# Patient Record
Sex: Female | Born: 1950 | Race: Black or African American | Hispanic: No | Smoking: Never smoker
Health system: Southern US, Community
[De-identification: ages and names within clinical notes are randomized; demographics above are authoritative.]

## PROBLEM LIST (undated history)

## (undated) DIAGNOSIS — G4730 Sleep apnea, unspecified: Secondary | ICD-10-CM

## (undated) DIAGNOSIS — J42 Unspecified chronic bronchitis: Secondary | ICD-10-CM

## (undated) DIAGNOSIS — F32A Depression, unspecified: Secondary | ICD-10-CM

## (undated) DIAGNOSIS — K5790 Diverticulosis of intestine, part unspecified, without perforation or abscess without bleeding: Secondary | ICD-10-CM

## (undated) DIAGNOSIS — G629 Polyneuropathy, unspecified: Secondary | ICD-10-CM

## (undated) DIAGNOSIS — A6000 Herpesviral infection of urogenital system, unspecified: Secondary | ICD-10-CM

## (undated) DIAGNOSIS — J45909 Unspecified asthma, uncomplicated: Secondary | ICD-10-CM

## (undated) DIAGNOSIS — M216X9 Other acquired deformities of unspecified foot: Secondary | ICD-10-CM

## (undated) DIAGNOSIS — I10 Essential (primary) hypertension: Secondary | ICD-10-CM

## (undated) DIAGNOSIS — D582 Other hemoglobinopathies: Secondary | ICD-10-CM

## (undated) DIAGNOSIS — B159 Hepatitis A without hepatic coma: Secondary | ICD-10-CM

## (undated) DIAGNOSIS — M1990 Unspecified osteoarthritis, unspecified site: Secondary | ICD-10-CM

## (undated) DIAGNOSIS — F329 Major depressive disorder, single episode, unspecified: Secondary | ICD-10-CM

---

## 2016-07-16 ENCOUNTER — Emergency Department (HOSPITAL_COMMUNITY)
Admission: EM | Admit: 2016-07-16 | Discharge: 2016-07-16 | Disposition: A | Discharge status: Home / Self Care | Payer: PRIVATE HEALTH INSURANCE | Attending: Emergency Medicine | Admitting: Emergency Medicine

## 2016-07-16 ENCOUNTER — Encounter (HOSPITAL_COMMUNITY): Payer: Self-pay

## 2016-07-16 ENCOUNTER — Emergency Department (HOSPITAL_COMMUNITY): Payer: PRIVATE HEALTH INSURANCE

## 2016-07-16 DIAGNOSIS — J45909 Unspecified asthma, uncomplicated: Secondary | ICD-10-CM | POA: Insufficient documentation

## 2016-07-16 DIAGNOSIS — R1030 Lower abdominal pain, unspecified: Secondary | ICD-10-CM | POA: Diagnosis present

## 2016-07-16 DIAGNOSIS — I10 Essential (primary) hypertension: Secondary | ICD-10-CM | POA: Diagnosis not present

## 2016-07-16 DIAGNOSIS — Z79899 Other long term (current) drug therapy: Secondary | ICD-10-CM | POA: Diagnosis not present

## 2016-07-16 DIAGNOSIS — M25551 Pain in right hip: Secondary | ICD-10-CM | POA: Diagnosis not present

## 2016-07-16 HISTORY — DX: Hepatitis a without hepatic coma: B15.9

## 2016-07-16 HISTORY — DX: Other acquired deformities of unspecified foot: M21.6X9

## 2016-07-16 HISTORY — DX: Polyneuropathy, unspecified: G62.9

## 2016-07-16 HISTORY — DX: Morbid (severe) obesity due to excess calories: E66.01

## 2016-07-16 HISTORY — DX: Major depressive disorder, single episode, unspecified: F32.9

## 2016-07-16 HISTORY — DX: Other hemoglobinopathies: D58.2

## 2016-07-16 HISTORY — DX: Depression, unspecified: F32.A

## 2016-07-16 HISTORY — DX: Unspecified chronic bronchitis: J42

## 2016-07-16 HISTORY — DX: Essential (primary) hypertension: I10

## 2016-07-16 HISTORY — DX: Unspecified osteoarthritis, unspecified site: M19.90

## 2016-07-16 HISTORY — DX: Herpesviral infection of urogenital system, unspecified: A60.00

## 2016-07-16 HISTORY — DX: Sleep apnea, unspecified: G47.30

## 2016-07-16 HISTORY — DX: Unspecified asthma, uncomplicated: J45.909

## 2016-07-16 HISTORY — DX: Diverticulosis of intestine, part unspecified, without perforation or abscess without bleeding: K57.90

## 2016-07-16 LAB — CBC WITH DIFFERENTIAL/PLATELET
BASOS PCT: 0 %
Basophils Absolute: 0 10*3/uL (ref 0.0–0.1)
EOS ABS: 0.1 10*3/uL (ref 0.0–0.7)
EOS PCT: 1 %
HCT: 34 % — ABNORMAL LOW (ref 36.0–46.0)
HEMOGLOBIN: 11 g/dL — AB (ref 12.0–15.0)
Lymphocytes Relative: 18 %
Lymphs Abs: 2.5 10*3/uL (ref 0.7–4.0)
MCH: 23.1 pg — AB (ref 26.0–34.0)
MCHC: 32.4 g/dL (ref 30.0–36.0)
MCV: 71.4 fL — ABNORMAL LOW (ref 78.0–100.0)
MONO ABS: 0.4 10*3/uL (ref 0.1–1.0)
MONOS PCT: 3 %
NEUTROS PCT: 78 %
Neutro Abs: 10.8 10*3/uL — ABNORMAL HIGH (ref 1.7–7.7)
PLATELETS: 365 10*3/uL (ref 150–400)
RBC: 4.76 MIL/uL (ref 3.87–5.11)
RDW: 15.9 % — AB (ref 11.5–15.5)
WBC: 13.9 10*3/uL — ABNORMAL HIGH (ref 4.0–10.5)

## 2016-07-16 LAB — COMPREHENSIVE METABOLIC PANEL
ALK PHOS: 65 U/L (ref 38–126)
ALT: 21 U/L (ref 14–54)
AST: 25 U/L (ref 15–41)
Albumin: 3.5 g/dL (ref 3.5–5.0)
Anion gap: 7 (ref 5–15)
BUN: 16 mg/dL (ref 6–20)
CALCIUM: 8.7 mg/dL — AB (ref 8.9–10.3)
CO2: 23 mmol/L (ref 22–32)
CREATININE: 0.76 mg/dL (ref 0.44–1.00)
Chloride: 103 mmol/L (ref 101–111)
GFR calc non Af Amer: >60 mL/min (ref >60)
Glucose, Bld: 112 mg/dL — ABNORMAL HIGH (ref 65–99)
Potassium: 4.2 mmol/L (ref 3.5–5.1)
SODIUM: 133 mmol/L — AB (ref 135–145)
Total Bilirubin: 0.7 mg/dL (ref 0.3–1.2)
Total Protein: 7.5 g/dL (ref 6.5–8.1)

## 2016-07-16 LAB — URINALYSIS, ROUTINE W REFLEX MICROSCOPIC
BILIRUBIN URINE: NEGATIVE
Glucose, UA: NEGATIVE mg/dL
HGB URINE DIPSTICK: NEGATIVE
KETONES UR: NEGATIVE mg/dL
Leukocytes, UA: NEGATIVE
NITRITE: NEGATIVE
PROTEIN: NEGATIVE mg/dL
Specific Gravity, Urine: 1.023 (ref 1.005–1.030)
pH: 5 (ref 5.0–8.0)

## 2016-07-16 MED ORDER — CYCLOBENZAPRINE HCL 5 MG PO TABS
5.0000 mg | ORAL_TABLET | Freq: Two times a day (BID) | ORAL | 0 refills | Status: AC | PRN
Start: 2016-07-16 — End: ?

## 2016-07-16 MED ORDER — HYDROCODONE-ACETAMINOPHEN 5-325 MG PO TABS
1.0000 | ORAL_TABLET | Freq: Once | ORAL | Status: AC
  Administered 2016-07-16: 1 via ORAL
  Filled 2016-07-16: qty 1

## 2016-07-16 MED ORDER — KETOROLAC TROMETHAMINE 30 MG/ML IJ SOLN
30.0000 mg | Freq: Once | INTRAMUSCULAR | Status: AC
  Administered 2016-07-16: 30 mg via INTRAMUSCULAR
  Filled 2016-07-16: qty 1

## 2016-07-16 MED ORDER — NAPROXEN 500 MG PO TABS: 500.0000 mg | ORAL_TABLET | Freq: Two times a day (BID) | ORAL | 0 refills | Status: AC

## 2016-07-16 NOTE — ED Notes (Signed)
Pt returned to room from xray.

## 2016-07-16 NOTE — ED Notes (Signed)
Patient left at this time with all belongings. 

## 2016-07-16 NOTE — ED Notes (Signed)
Pt ambulatory without assistance to bathroom.  ?

## 2016-07-16 NOTE — ED Provider Notes (Signed)
[facility]-EMERGENCY DEPT Provider Note   CSN: [id number] Arrival date & time: 07/16/16  0113     History   Chief Complaint Chief Complaint  Patient presents with  . Groin Pain    HPI Adriana Murphy is a 66 y.o. female.  HPI  This is a 66 year old female who presents with right-sided groin pain. Patient reports onset of symptoms and worsening of symptoms in the last 24 hours. She states that she did do a lot of standing on Saturday. She did physical therapy exercises on Sunday with increasing pain of the right groin. Pain is worse with sitting. It is nonradiating. Currently it is 10 out of 10. She has not taken anything at home for the pain. She has been ambulatory and weightbearing. Denies any abdominal pain, nausea, vomiting urinary symptoms. Denies any fevers. She is from [place] visiting.  Past Medical History:  Diagnosis Date  . Arthritis   . Asthma   . Chronic bronchitis (HCC)   . Depression   . Diverticulosis   . Genital herpes   . Hemoglobin C trait (HCC)   . Hepatitis A   . Hypertension   . Morbid obesity (HCC)   . Neuropathy   . Pronation of feet   . Sleep apnea     There are no active problems to display for this patient.   History reviewed. No pertinent surgical history.  OB History    No data available       Home Medications    Prior to Admission medications   Medication Sig Start Date End Date Taking? Authorizing Provider  acyclovir (ZOVIRAX) 400 MG tablet Take 400 mg by mouth 2 (two) times daily.   Yes [provider]  albuterol (PROVENTIL HFA;VENTOLIN HFA) 108 (90 Base) MCG/ACT inhaler Inhale 1-2 puffs into the lungs every 6 (six) hours as needed for wheezing or shortness of breath.   Yes [provider]  budesonide-formoterol (SYMBICORT) 160-4.5 MCG/ACT inhaler Inhale 2 puffs into the lungs 2 (two) times daily.   Yes [provider]  buPROPion (WELLBUTRIN XL) 300 MG 24 hr tablet Take 300 mg by mouth daily.   Yes [provider]  fluticasone (FLONASE) 50 MCG/ACT nasal spray Place 2 sprays into both nostrils daily.   Yes [provider]  loratadine-pseudoephedrine (CLARITIN-D 24-HOUR) 10-240 MG 24 hr tablet Take 1 tablet by mouth daily.   Yes [provider]  montelukast (SINGULAIR) 10 MG tablet Take 10 mg by mouth daily.   Yes [provider]  PARoxetine (PAXIL) 40 MG tablet Take 40 mg by mouth daily.   Yes [provider]  traZODone (DESYREL) 50 MG tablet Take 25 mg by mouth at bedtime.   Yes [provider]  valsartan (DIOVAN) 80 MG tablet Take 80 mg by mouth daily.   Yes [provider]  cyclobenzaprine (FLEXERIL) 5 MG tablet Take 1 tablet (5 mg total) by mouth 2 (two) times daily as needed for muscle spasms. 07/16/16   Horton, Mayer Masker, MD  naproxen (NAPROSYN) 500 MG tablet Take 1 tablet (500 mg total) by mouth 2 (two) times daily. 07/16/16   Horton, Mayer Masker, MD    Family History History reviewed. No pertinent family history.  Social History Social History  Substance Use Topics  . Smoking status: Never Smoker  . Smokeless tobacco: Not on file  . Alcohol use No     Allergies   Neosporin [neomycin-bacitracin zn-polymyx]; Sulfa antibiotics; Darvon [propoxyphene]; Demerol [meperidine]; and Percocet [oxycodone-acetaminophen]  Review of Systems Review of Systems  Constitutional: Negative for fever.  Gastrointestinal: Negative for abdominal pain and vomiting.  Musculoskeletal: Negative for back pain.       Right hip pain  All other systems reviewed and are negative.    Physical Exam Updated Vital Signs BP (!) 157/78 (BP Location: Right Arm)   Pulse 79   Temp 97.5 F (36.4 C) (Oral)   Resp 18   Ht 5' 4.5" (1.638 m)   Wt (!) 139.7 kg (308 lb)   SpO2 96%   BMI 52.05 kg/m   Physical Exam  Constitutional: She is oriented to person, place, and time. She appears well-developed and well-nourished.  Morbidly obese  HENT:  Head:  Normocephalic and atraumatic.  Cardiovascular: Normal rate, regular rhythm and normal heart sounds.   Pulmonary/Chest: Effort normal and breath sounds normal. No respiratory distress. She has no wheezes.  Abdominal: Soft. Bowel sounds are normal. There is no tenderness. There is no guarding.  Musculoskeletal:  Normal range of motion of the right hip, tenderness to palpation of the lateral aspect of the hip, no overlying skin changes, no masses noted in the groin  Neurological: She is alert and oriented to person, place, and time.  Skin: Skin is warm and dry.  Psychiatric: She has a normal mood and affect.  Nursing note and vitals reviewed.    ED Treatments / Results  Labs (all labs ordered are listed, but only abnormal results are displayed) Labs Reviewed  COMPREHENSIVE METABOLIC PANEL - Abnormal; Notable for the following:       Result Value   Sodium 133 (*)    Glucose, Bld 112 (*)    Calcium 8.7 (*)    All other components within normal limits  CBC WITH DIFFERENTIAL/PLATELET - Abnormal; Notable for the following:    WBC 13.9 (*)    Hemoglobin 11.0 (*)    HCT 34.0 (*)    MCV 71.4 (*)    MCH 23.1 (*)    RDW 15.9 (*)    Neutro Abs 10.8 (*)    All other components within normal limits  URINALYSIS, ROUTINE W REFLEX MICROSCOPIC - Abnormal; Notable for the following:    APPearance HAZY (*)    All other components within normal limits    EKG  EKG Interpretation None       Radiology Dg Hip Unilat W Or Wo Pelvis 2-3 Views Right  Result Date: 07/16/2016 CLINICAL DATA:  Right hip pain for 1 day.  No known injury. EXAM: DG HIP (WITH OR WITHOUT PELVIS) 2-3V RIGHT COMPARISON:  None. FINDINGS: The cortical margins of the bony pelvis and right hip are intact. No fracture. Pubic symphysis and sacroiliac joints are congruent. Both femoral heads are well-seated in the respective acetabula. Mild right acetabular spurring. IMPRESSION: Mild right hip osteoarthritis.  No acute bony  abnormality. Electronically Signed   By: Rubye OaksMelanie  Ehinger M.D.   On: 07/16/2016 05:21    Procedures Procedures (including critical care time)  Medications Ordered in ED Medications  ketorolac (TORADOL) 30 MG/ML injection 30 mg (30 mg Intramuscular Given 07/16/16 0440)  HYDROcodone-acetaminophen (NORCO/VICODIN) 5-325 MG per tablet 1 tablet (1 tablet Oral Given 07/16/16 0440)  HYDROcodone-acetaminophen (NORCO/VICODIN) 5-325 MG per tablet 1 tablet (1 tablet Oral Given 07/16/16 [id number])     Initial Impression / Assessment and Plan / ED Course  I have reviewed the triage vital signs and the nursing notes.  Pertinent labs & imaging results that were available during my care of  the patient were reviewed by me and considered in my medical decision making (see chart for details).     Patient presents with right groin and hip pain. Worse with certain movements and sitting. Seems musculoskeletal in nature. She has no abdominal pain or reproducible tenderness on exam. She is neurovascularly intact. Labs sent from triage reviewed and notable for a leukocytosis of unclear significance. Patient denies any fevers and there are no overlying skin changes to suggest cellulitis. X-rays show right-sided hip arthritis. Patient much improved with Toradol and Norco. She is morbidly obese which likely exacerbates her arthritis. Her exam is otherwise reassuring. Will discharge home with a short course of naproxen and Flexeril.  After history, exam, and medical workup I feel the patient has been appropriately medically screened and is safe for discharge home. Pertinent diagnoses were discussed with the patient. Patient was given return precautions.   Final Clinical Impressions(s) / ED Diagnoses   Final diagnoses:  Right hip pain    New Prescriptions New Prescriptions   CYCLOBENZAPRINE (FLEXERIL) 5 MG TABLET    Take 1 tablet (5 mg total) by mouth 2 (two) times daily as needed for muscle spasms.   NAPROXEN  (NAPROSYN) 500 MG TABLET    Take 1 tablet (500 mg total) by mouth 2 (two) times daily.     Shon Baton, MD 07/16/16 [phone]

## 2016-07-16 NOTE — Discharge Instructions (Signed)
You were seen today for pain in her right hip and groin. Your x-ray showed arthritis of the right hip. There otherwise unremarkable. Follow-up closely with your primary physician.

## 2016-07-16 NOTE — ED Triage Notes (Signed)
Per EMS, pt endorses right side groin pain since yesterday. Denies injury, heavy lifting and denies urinary sx. No increased pain or palpation. VS 160/69, Pulse 90, RR 18.

## 2018-07-28 IMAGING — CR DG HIP (WITH OR WITHOUT PELVIS) 2-3V*R*
4 series · 4 of 4 positions shown · non-contrast
Comparison: None.

CLINICAL DATA: Right hip pain for 1 day.  No known injury.

EXAM:
DG HIP (WITH OR WITHOUT PELVIS) 2-3V RIGHT

[pelvis ap (1 of 2)]
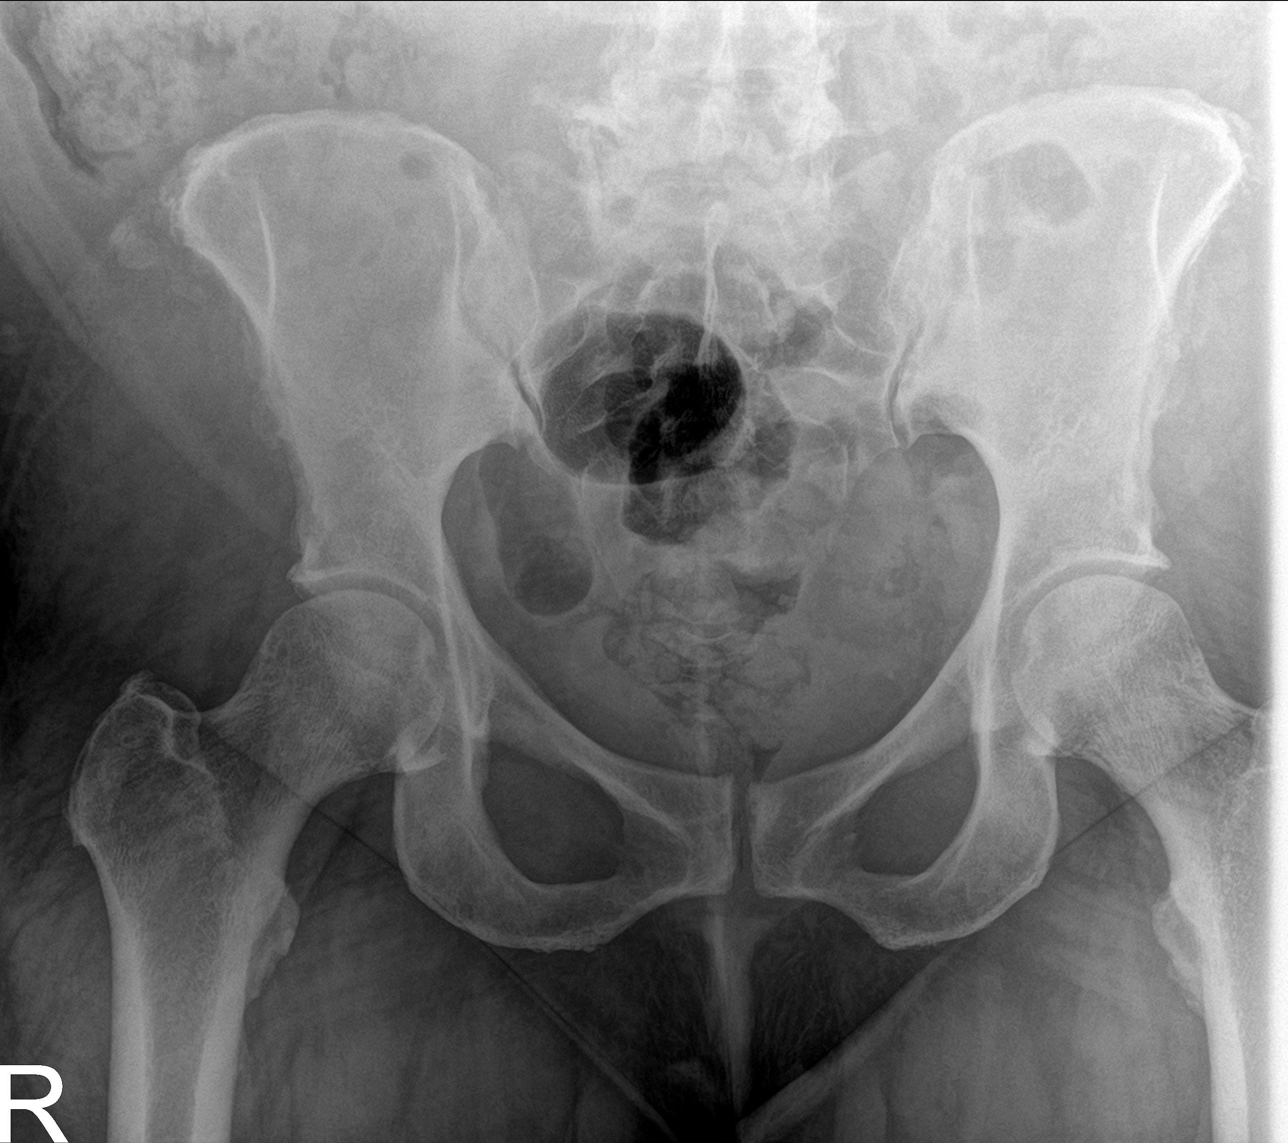

[hip ap]
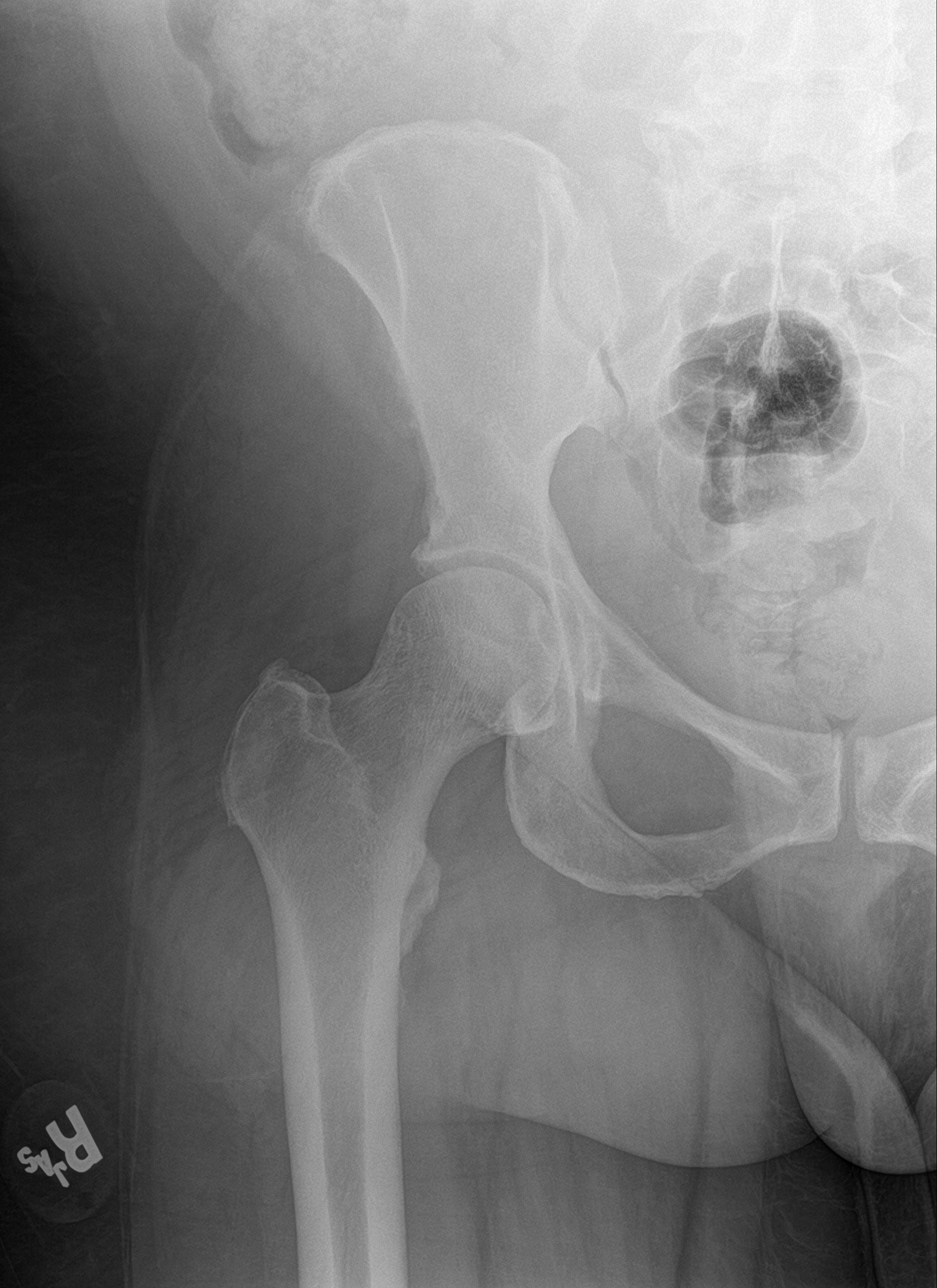

[hip lat]
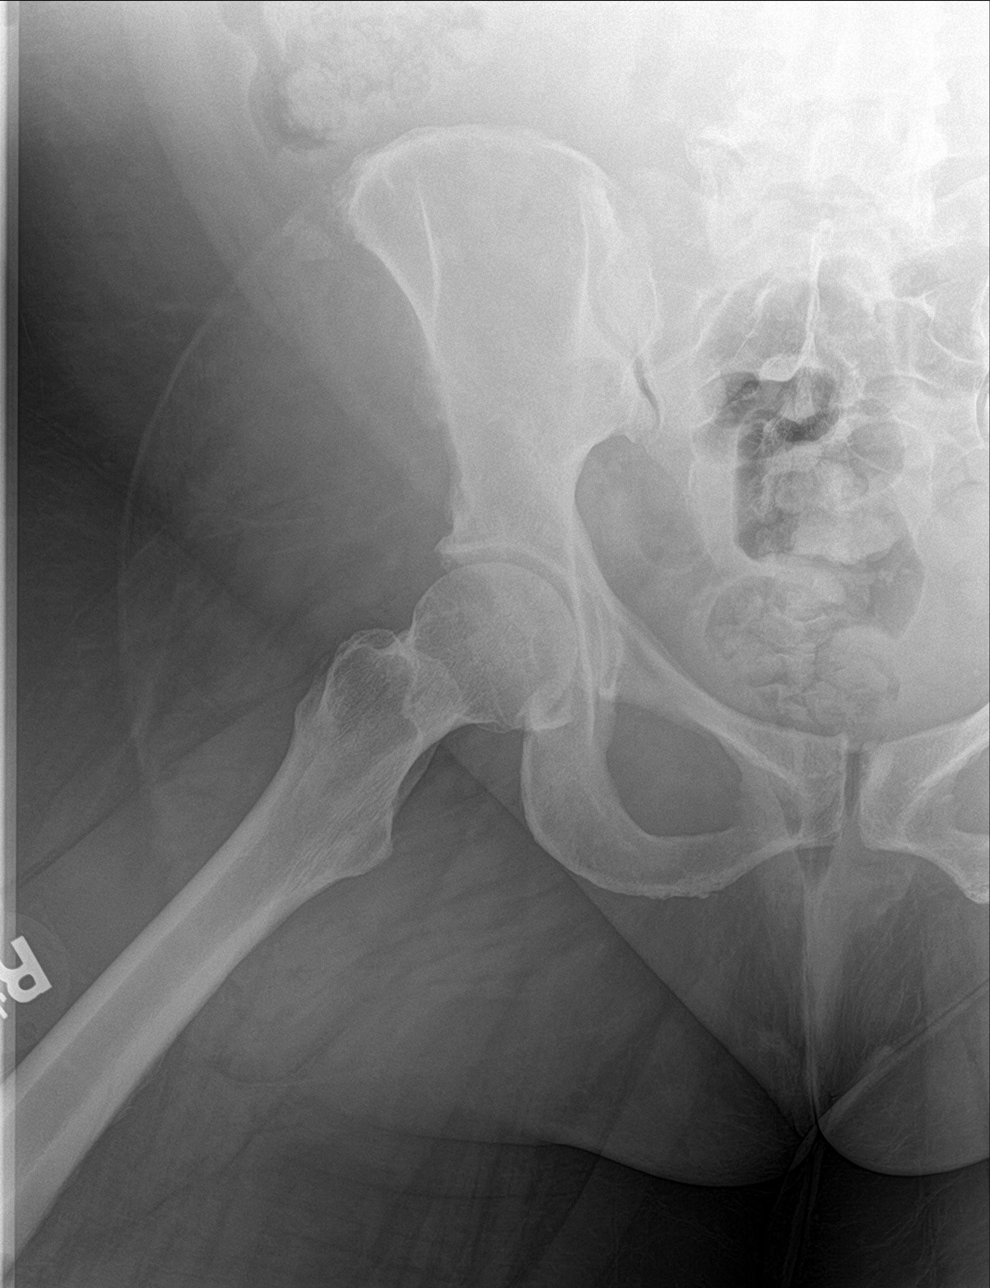

[pelvis ap (2 of 2)]
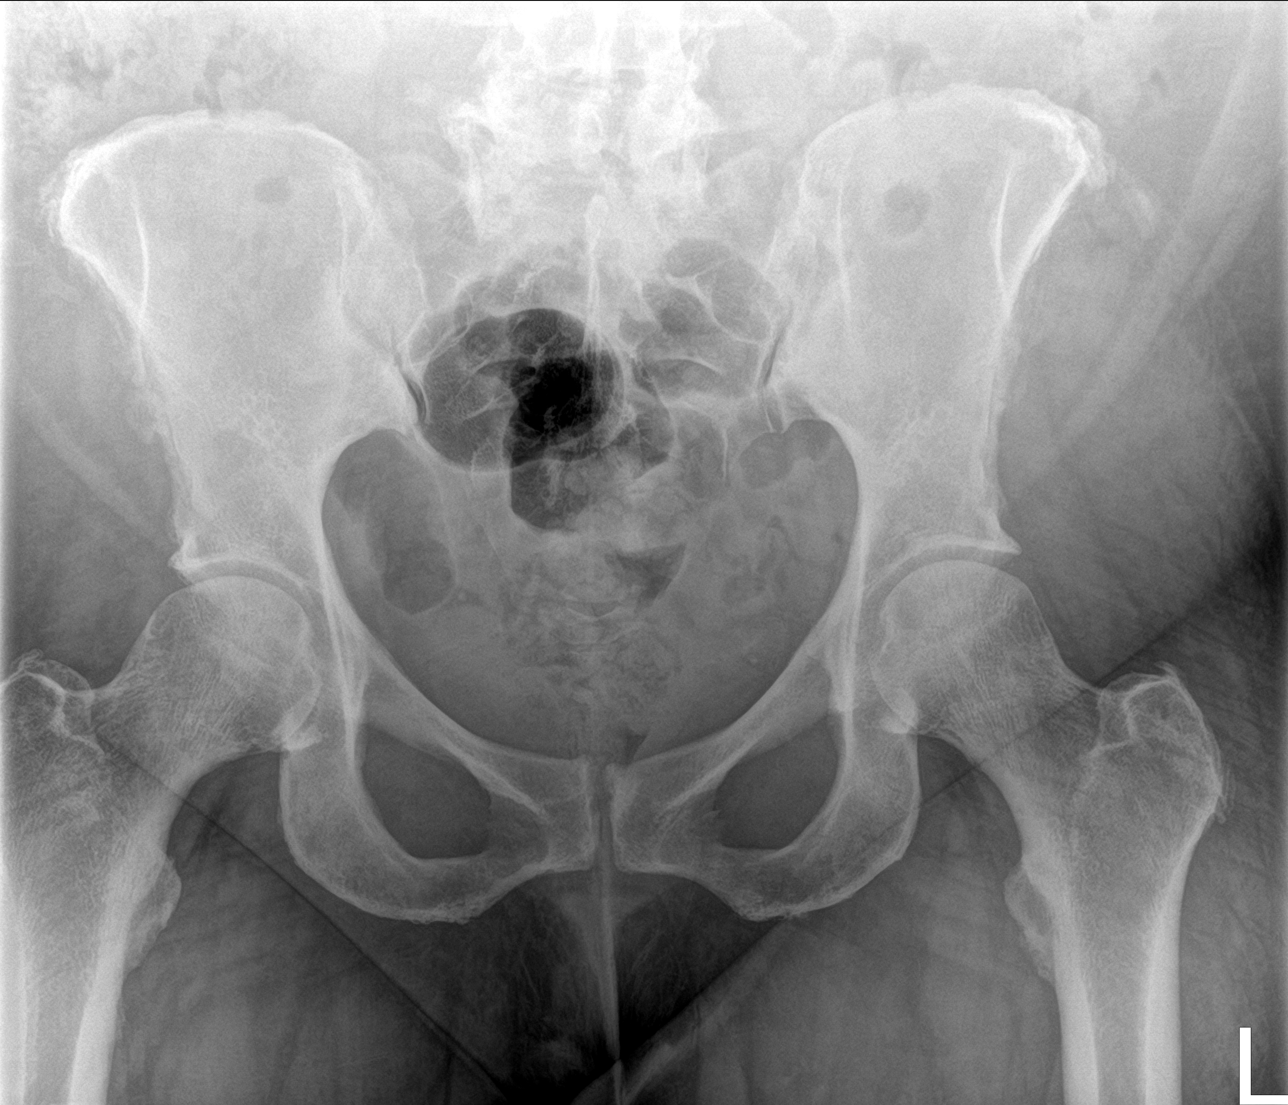

[4 of 4 positions shown; findings below may reference images not displayed]

FINDINGS: The cortical margins of the bony pelvis and right hip are intact. No
fracture. Pubic symphysis and sacroiliac joints are congruent. Both
femoral heads are well-seated in the respective acetabula. Mild
right acetabular spurring.
IMPRESSION: Mild right hip osteoarthritis.  No acute bony abnormality.
